# Patient Record
Sex: Female | Born: 2000 | Race: Black or African American | Hispanic: No | Marital: Single | Smoking: Never smoker
Health system: Southern US, Community
[De-identification: ages and names within clinical notes are randomized; demographics above are authoritative.]

## PROBLEM LIST (undated history)

## (undated) DIAGNOSIS — J45909 Unspecified asthma, uncomplicated: Secondary | ICD-10-CM

## (undated) DIAGNOSIS — F4010 Social phobia, unspecified: Secondary | ICD-10-CM

## (undated) HISTORY — PX: ADENOIDECTOMY: SUR15

## (undated) HISTORY — PX: TONSILLECTOMY: SUR1361

---
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
## 2020-03-25 ENCOUNTER — Other Ambulatory Visit: Payer: Medicaid Other

## 2020-03-29 ENCOUNTER — Emergency Department
Admission: EM | Admit: 2020-03-29 | Discharge: 2020-03-29 | Disposition: A | Discharge status: Home / Self Care | Payer: Medicaid Other | Attending: Emergency Medicine | Admitting: Emergency Medicine

## 2020-03-29 ENCOUNTER — Encounter: Payer: Self-pay | Admitting: Emergency Medicine

## 2020-03-29 ENCOUNTER — Other Ambulatory Visit: Payer: Self-pay

## 2020-03-29 ENCOUNTER — Emergency Department: Payer: Medicaid Other

## 2020-03-29 DIAGNOSIS — R05 Cough: Secondary | ICD-10-CM | POA: Insufficient documentation

## 2020-03-29 DIAGNOSIS — R059 Cough, unspecified: Secondary | ICD-10-CM

## 2020-03-29 DIAGNOSIS — Z8616 Personal history of COVID-19: Secondary | ICD-10-CM | POA: Insufficient documentation

## 2020-03-29 DIAGNOSIS — R509 Fever, unspecified: Secondary | ICD-10-CM | POA: Diagnosis not present

## 2020-03-29 MED ORDER — AZITHROMYCIN 250 MG PO TABS: ORAL_TABLET | ORAL | 0 refills | Status: AC

## 2020-03-29 MED ORDER — PREDNISONE 50 MG PO TABS: ORAL_TABLET | ORAL | 0 refills | Status: DC

## 2020-03-29 NOTE — ED Triage Notes (Signed)
Pt reports cough for the past 2 weeks with congestion sore throat and fever, reports temp of 101.0 at home without medication, pt states that her work place wants her to be covid tested

## 2020-03-29 NOTE — ED Notes (Addendum)
Tested pos on 02/08/2021See triage note  Presents with a 2 week hx of cough and sore throat  States she did have temp PTA of 101  Low grade noted on arrival  States her job wants her to have another COVID test  States she has had COVID in past   Tested pos.on02/06/2020

## 2020-03-29 NOTE — Discharge Instructions (Addendum)
Take azithromycin as directed. Take prednisone once daily for the next 5 days 

## 2020-03-29 NOTE — ED Provider Notes (Signed)
Emergency Department Provider Note  ____________________________________________  Time seen: Approximately 7:20 PM  I have reviewed the triage vital signs and the nursing notes.   HISTORY  Chief Complaint Cough   Historian Patient     HPI Katherine Wong is a 19 y.o. female presents to the emergency department with 2 weeks of productive cough and fever that started today.  Patient denies associated rhinorrhea or nasal congestion.  She states that she had COVID-19 in February of this year.  No chest pain, chest tightness or abdominal pain.  She has had headache and diarrhea.  No sick contacts in the home.  No other alleviating measures have been attempted.   Past Medical History:  Diagnosis Date  . Asthma    as a child  . Social anxiety disorder 02/23/2016     Immunizations up to date:  Yes.     Past Medical History:  Diagnosis Date  . Asthma    as a child  . Social anxiety disorder 02/23/2016    Patient Active Problem List   Diagnosis Date Noted  . Social anxiety disorder 02/23/2016  . MDD (major depressive disorder), recurrent episode, severe ([place]) 02/22/2016  . Right ovarian cyst 10/04/2015    Past Surgical History:  Procedure Laterality Date  . ADENOIDECTOMY    . LAPAROSCOPY N/A 10/04/2015   Procedure: LAPAROSCOPY DIAGNOSTIC;  Surgeon: Aletha Halim, MD;  Location: [facility] ORS;  Service: Gynecology;  Laterality: N/A;  . OVARIAN CYST REMOVAL Right 10/04/2015   Procedure: OVARIAN CYSTECTOMY;  Surgeon: Aletha Halim, MD;  Location: [facility] ORS;  Service: Gynecology;  Laterality: Right;  . TONSILLECTOMY      Prior to Admission medications   Medication Sig Start Date End Date Taking? Authorizing Provider  azithromycin (ZITHROMAX Z-PAK) 250 MG tablet Take 2 tablets (500 mg) on  Day 1,  followed by 1 tablet (250 mg) once daily on Days 2 through 5. 03/29/20 04/03/20  Lannie Fields, PA-C  predniSONE (DELTASONE) 50 MG tablet Take one tablet once daily for the next  five days. 03/29/20   Lannie Fields, PA-C    Allergies Vancomycin and Influenza vaccines  No family history on file.  Social History Social History   Tobacco Use  . Smoking status: Never Smoker  . Smokeless tobacco: Never Used  Substance Use Topics  . Alcohol use: No  . Drug use: No     Review of Systems  Constitutional: Patient has fever.  Eyes:  No discharge ENT: No upper respiratory complaints. Respiratory: Patient has cough.  Gastrointestinal:   No nausea, no vomiting.  No diarrhea.  No constipation. Musculoskeletal: Negative for musculoskeletal pain. Skin: Negative for rash, abrasions, lacerations, ecchymosis.    ____________________________________________   PHYSICAL EXAM:  VITAL SIGNS: ED Triage Vitals  Enc Vitals Group     BP 03/29/20 1816 122/80     Pulse Rate 03/29/20 1816 99     Resp 03/29/20 1816 16     Temp 03/29/20 1816 99.8 F (37.7 C)     Temp Source 03/29/20 1816 Oral     SpO2 03/29/20 1816 98 %     Weight 03/29/20 1815 247 lb (112 kg)     Height 03/29/20 1815 5\' 3"  (1.6 m)     Head Circumference --      Peak Flow --      Pain Score 03/29/20 1825 5     Pain Loc --      Pain Edu? --      Excl.  in GC? --      Constitutional: Alert and oriented. Well appearing and in no acute distress. Eyes: Conjunctivae are normal. PERRL. EOMI. Head: Atraumatic. ENT:      Nose: No congestion/rhinnorhea.      Mouth/Throat: Mucous membranes are moist.  Neck: No stridor.  No cervical spine tenderness to palpation. Cardiovascular: Normal rate, regular rhythm. Normal S1 and S2.  Good peripheral circulation. Respiratory: Normal respiratory effort without tachypnea or retractions. Lungs CTAB. Good air entry to the bases with no decreased or absent breath sounds Gastrointestinal: Bowel sounds x 4 quadrants. Soft and nontender to palpation. No guarding or rigidity. No distention. Musculoskeletal: Full range of motion to all extremities. No obvious deformities  noted Neurologic:  Normal for age. No gross focal neurologic deficits are appreciated.  Skin:  Skin is warm, dry and intact. No rash noted. Psychiatric: Mood and affect are normal for age. Speech and behavior are normal.   ____________________________________________   LABS (all labs ordered are listed, but only abnormal results are displayed)  Labs Reviewed  SARS CORONAVIRUS 2 (TAT 6-24 HRS)   ____________________________________________  EKG   ____________________________________________  RADIOLOGY Geraldo Pitter, personally viewed and evaluated these images (plain radiographs) as part of my medical decision making, as well as reviewing the written report by the radiologist.  DG Chest 1 View  Result Date: 03/29/2020 CLINICAL DATA:  Cough EXAM: CHEST  1 VIEW COMPARISON:  03/31/2017 FINDINGS: The heart size and mediastinal contours are within normal limits. Both lungs are clear. The visualized skeletal structures are unremarkable. IMPRESSION: No active disease. Electronically Signed   By: Charlett Nose M.D.   On: 03/29/2020 18:53    ____________________________________________    PROCEDURES  Procedure(s) performed:     Procedures     Medications - No data to display   ____________________________________________   INITIAL IMPRESSION / ASSESSMENT AND PLAN / ED COURSE  Pertinent labs & imaging results that were available during my care of the patient were reviewed by me and considered in my medical decision making (see chart for details).      Assessment and plan Bronchitis 19 year old female presents to the emergency department with productive cough with purulent sputum production for the past 2 weeks.  Patient was hypertensive at triage but vital signs were otherwise reassuring.  She had no adventitious lung sounds auscultated.  Patient underwent send off COVID-19 testing.  I treated patient with azithromycin and prednisone.  Rest and hydration were  encouraged at home.  Return precautions were given to return with new or worsening symptoms.   ____________________________________________  FINAL CLINICAL IMPRESSION(S) / ED DIAGNOSES  Final diagnoses:  Cough      NEW MEDICATIONS STARTED DURING THIS VISIT:  ED Discharge Orders         Ordered    azithromycin (ZITHROMAX Z-PAK) 250 MG tablet     03/29/20 1957    predniSONE (DELTASONE) 50 MG tablet     03/29/20 1957              This chart was dictated using voice recognition software/Dragon. Despite best efforts to proofread, errors can occur which can change the meaning. Any change was purely unintentional.     Orvil Feil, PA-C 03/29/20 2048    Phineas Semen, MD 03/29/20 [phone]

## 2020-06-16 ENCOUNTER — Telehealth: Payer: Self-pay | Admitting: Family Medicine

## 2020-06-16 NOTE — Telephone Encounter (Signed)
Pt needs an excuse note for work from 05/26/20 visit and the morning of 7/29.

## 2020-06-16 NOTE — Telephone Encounter (Signed)
Call to client to ascertain need. Per client, felt like had food poisoning and missed work yesterday and this am. States called to make appt to be seen today, but told could not be seen. States if was able to be seen, hoped to get note for work. Client states ended up going to Urgent Care this am. Client counseled that our agency does not offer a "sick clinic" like an Urgent Care. Jossie Ng, RN

## 2021-11-16 ENCOUNTER — Other Ambulatory Visit: Payer: Self-pay

## 2021-11-16 ENCOUNTER — Emergency Department: Payer: Medicaid Other

## 2021-11-16 ENCOUNTER — Emergency Department
Admission: EM | Admit: 2021-11-16 | Discharge: 2021-11-16 | Disposition: A | Discharge status: Home / Self Care | Payer: Medicaid Other | Attending: Emergency Medicine | Admitting: Emergency Medicine

## 2021-11-16 DIAGNOSIS — J45909 Unspecified asthma, uncomplicated: Secondary | ICD-10-CM | POA: Insufficient documentation

## 2021-11-16 DIAGNOSIS — J111 Influenza due to unidentified influenza virus with other respiratory manifestations: Secondary | ICD-10-CM | POA: Diagnosis present

## 2021-11-16 MED ORDER — ALBUTEROL SULFATE HFA 108 (90 BASE) MCG/ACT IN AERS: 2.0000 | INHALATION_SPRAY | Freq: Four times a day (QID) | RESPIRATORY_TRACT | 2 refills | Status: AC | PRN

## 2021-11-16 NOTE — ED Provider Notes (Signed)
[facility] Emergency Department Provider Note    ____________________________________________   Event Date/Time   First MD Initiated Contact with Patient 11/16/21 1620     (approximate)  I have reviewed the triage vital signs and the nursing notes.   HISTORY  Chief Complaint Influenza   HPI Katherine Wong is a 20 y.o. female, history of asthma and social anxiety, presents the emergency department for evaluation of influenza.  Patient was diagnosed with flu 2 days ago, despite negative flu/COVID testing at urgent care.  She was prescribed Tamiflu and has been taking Tylenol.  She states that despite these treatments, she says she feels worse today.  Reports chest tightness and body aches.  Denies fever/chills, abdominal pain, urinary symptoms, headaches, back pain, or flank pain.  History limited by: No limitations.  Past Medical History:  Diagnosis Date   Asthma    as a child   Social anxiety disorder 02/23/2016    Patient Active Problem List   Diagnosis Date Noted   Social anxiety disorder 02/23/2016   MDD (major depressive disorder), recurrent episode, severe ([place]) 02/22/2016   Right ovarian cyst 10/04/2015    Past Surgical History:  Procedure Laterality Date   ADENOIDECTOMY     LAPAROSCOPY N/A 10/04/2015   Procedure: LAPAROSCOPY DIAGNOSTIC;  Surgeon: Aletha Halim, MD;  Location: [facility] ORS;  Service: Gynecology;  Laterality: N/A;   OVARIAN CYST REMOVAL Right 10/04/2015   Procedure: OVARIAN CYSTECTOMY;  Surgeon: Aletha Halim, MD;  Location: [facility] OR[facility];  Service: Gynecology;  Laterality: Right;   TONSILLECTOMY      Prior to Admission medications   Medication Sig Start Date End Date Taking? Authorizing Provider  albuterol (VENTOLIN HFA) 108 (90 Base) MCG/ACT inhaler Inhale 2 puffs into the lungs every 6 (six) hours as needed for wheezing or shortness of breath. 11/16/21  Yes Teodoro Spray, PA  predniSONE (DELTASONE) 50 MG tablet Take  one tablet once daily for the next five days. 03/29/20   Lannie Fields, PA-C    Allergies Vancomycin and Influenza vaccines  No family history on file.  Social History Social History   Tobacco Use   Smoking status: Never   Smokeless tobacco: Never  Substance Use Topics   Alcohol use: No   Drug use: No    Review of Systems Review of Systems  Constitutional:  Negative for chills and fever.  HENT:  Negative for ear pain and sore throat.   Eyes:  Negative for blurred vision.  Respiratory:  Positive for cough. Negative for sputum production and shortness of breath.   Cardiovascular:  Positive for chest pain. Negative for leg swelling.  Gastrointestinal:  Negative for abdominal pain and vomiting.  Genitourinary:  Negative for dysuria, flank pain and hematuria.  Musculoskeletal:  Positive for myalgias.  Skin:  Negative for rash.  Neurological:  Negative for headaches.    10-point ROS otherwise negative. ____________________________________________   PHYSICAL EXAM:  VITAL SIGNS: ED Triage Vitals  Enc Vitals Group     BP 11/16/21 1526 124/78     Pulse Rate 11/16/21 1526 95     Resp 11/16/21 1526 20     Temp 11/16/21 1526 98.7 F (37.1 C)     Temp Source 11/16/21 1526 Oral     SpO2 11/16/21 1526 97 %     Weight 11/16/21 1524 201 lb (91.2 kg)     Height 11/16/21 1524 5\' 3"  (1.6 m)     Head Circumference --  Peak Flow --      Pain Score 11/16/21 1524 9     Pain Loc --      Pain Edu? --      Excl. in GC? --     Physical Exam Constitutional:      General: She is not in acute distress.    Appearance: Normal appearance. She is not ill-appearing.  HENT:     Head: Normocephalic.     Nose: Nose normal.     Mouth/Throat:     Mouth: Mucous membranes are moist.     Pharynx: Oropharynx is clear. No oropharyngeal exudate or posterior oropharyngeal erythema.  Eyes:     Conjunctiva/sclera: Conjunctivae normal.     Pupils: Pupils are equal, round, and reactive to  light.  Cardiovascular:     Rate and Rhythm: Normal rate and regular rhythm.  Pulmonary:     Effort: Pulmonary effort is normal. No respiratory distress.     Breath sounds: Normal breath sounds. No wheezing, rhonchi or rales.  Abdominal:     General: Abdomen is flat. There is no distension.     Palpations: Abdomen is soft.  Musculoskeletal:        General: Normal range of motion.     Cervical back: Normal range of motion and neck supple.  Skin:    General: Skin is warm and dry.  Neurological:     General: No focal deficit present.     Mental Status: She is alert. Mental status is at baseline.  Psychiatric:        Mood and Affect: Mood normal.        Behavior: Behavior normal.        Thought Content: Thought content normal.        Judgment: Judgment normal.     ____________________________________________    LABS  (all labs ordered are listed, but only abnormal results are displayed)  Labs Reviewed - No data to display   ____________________________________________   EKG Not applicable.   ____________________________________________    RADIOLOGY I personally viewed and evaluated these images as part of my medical decision making, as well as reviewing the written report by the radiologist.  ED Provider Interpretation: I agree with the interpretation of the radiologist.  No active cardiopulmonary disease.  DG Chest 2 View  Result Date: 11/16/2021 CLINICAL DATA:  Shortness of breath EXAM: CHEST - 2 VIEW COMPARISON:  03/29/2020 FINDINGS: The heart size and mediastinal contours are within normal limits. Both lungs are clear. The visualized skeletal structures are unremarkable. IMPRESSION: No active cardiopulmonary disease. Electronically Signed   By: Jasmine Pang M.D.   On: 11/16/2021 17:52    ____________________________________________   PROCEDURES  Procedures   Medications - No data to display  Critical Care performed: No   ____________________________________________   INITIAL IMPRESSION / ASSESSMENT AND PLAN / ED COURSE  Pertinent labs & imaging results that were available during my care of the patient were reviewed by me and considered in my medical decision making (see chart for details).      Katherine Wong is a 20 y.o. female, history of asthma and social anxiety, presents the emergency department for evaluation of influenza.  Patient was diagnosed with flu 2 days ago, despite negative flu/COVID testing at urgent care.  She was prescribed Tamiflu and has been taking Tylenol.  She states that despite these treatments, she says she feels worse today.  Reports chest tightness and body aches.  Denies fever/chills, abdominal pain, urinary symptoms,  headaches, back pain, or flank pain.  Upon entering the room, patient appears well.  NAD.  Physical exam overall unremarkable.  Lung sounds are clear bilaterally.  Throat is not erythematous.  No exudates.  Uvula midline.  Abdomen soft and nondistended.  Vital signs are within normal limits.  Chest x-ray shows no focal consolidations.  No active cardiopulmonary disease.  Given the patient's history, physical exam, and work-up, I suspect that the patient is experiencing an influenza-like illness.  I do not suspect any life-threatening pathology.  Given that she is only 2 days into her symptoms, it is understandable that she may feel worse today than she did yesterday.  We will discharge this patient with a prescription for albuterol as well as educational material, anticipatory guidance, and return precautions.  Encouraged the patient to return to the emergency department at any time if she begins to experience any new or worsening symptoms.           ____________________________________________   FINAL CLINICAL IMPRESSION(S) / ED DIAGNOSES  Final diagnoses:  Influenza     NEW MEDICATIONS STARTED DURING THIS VISIT:  ED Discharge Orders           Ordered    albuterol (VENTOLIN HFA) 108 (90 Base) MCG/ACT inhaler  Every 6 hours PRN        11/16/21 1906             Note:  This document was prepared using Dragon voice recognition software and may include unintentional dictation errors.    Teodoro Spray, [place] 11/17/21 [id number]    Vladimir Crofts, MD 11/17/21 [phone]

## 2021-11-16 NOTE — Discharge Instructions (Addendum)
-  Please return to the emergency department if you begin to experience any new or worsening symptoms. °

## 2021-11-16 NOTE — ED Triage Notes (Signed)
Pt states she was diagnosed with the flu 2 days ago despite testing negative for flu and covid on [facility] rapid test- pt states she feels worse today despite taking the tamiflu and taking tylenol

## 2022-03-11 IMAGING — CR DG CHEST 2V
2 series · 2 of 2 positions shown · non-contrast
Comparison: 03/29/2020

CLINICAL DATA: Shortness of breath

EXAM:
CHEST - 2 VIEW

[chest pa]
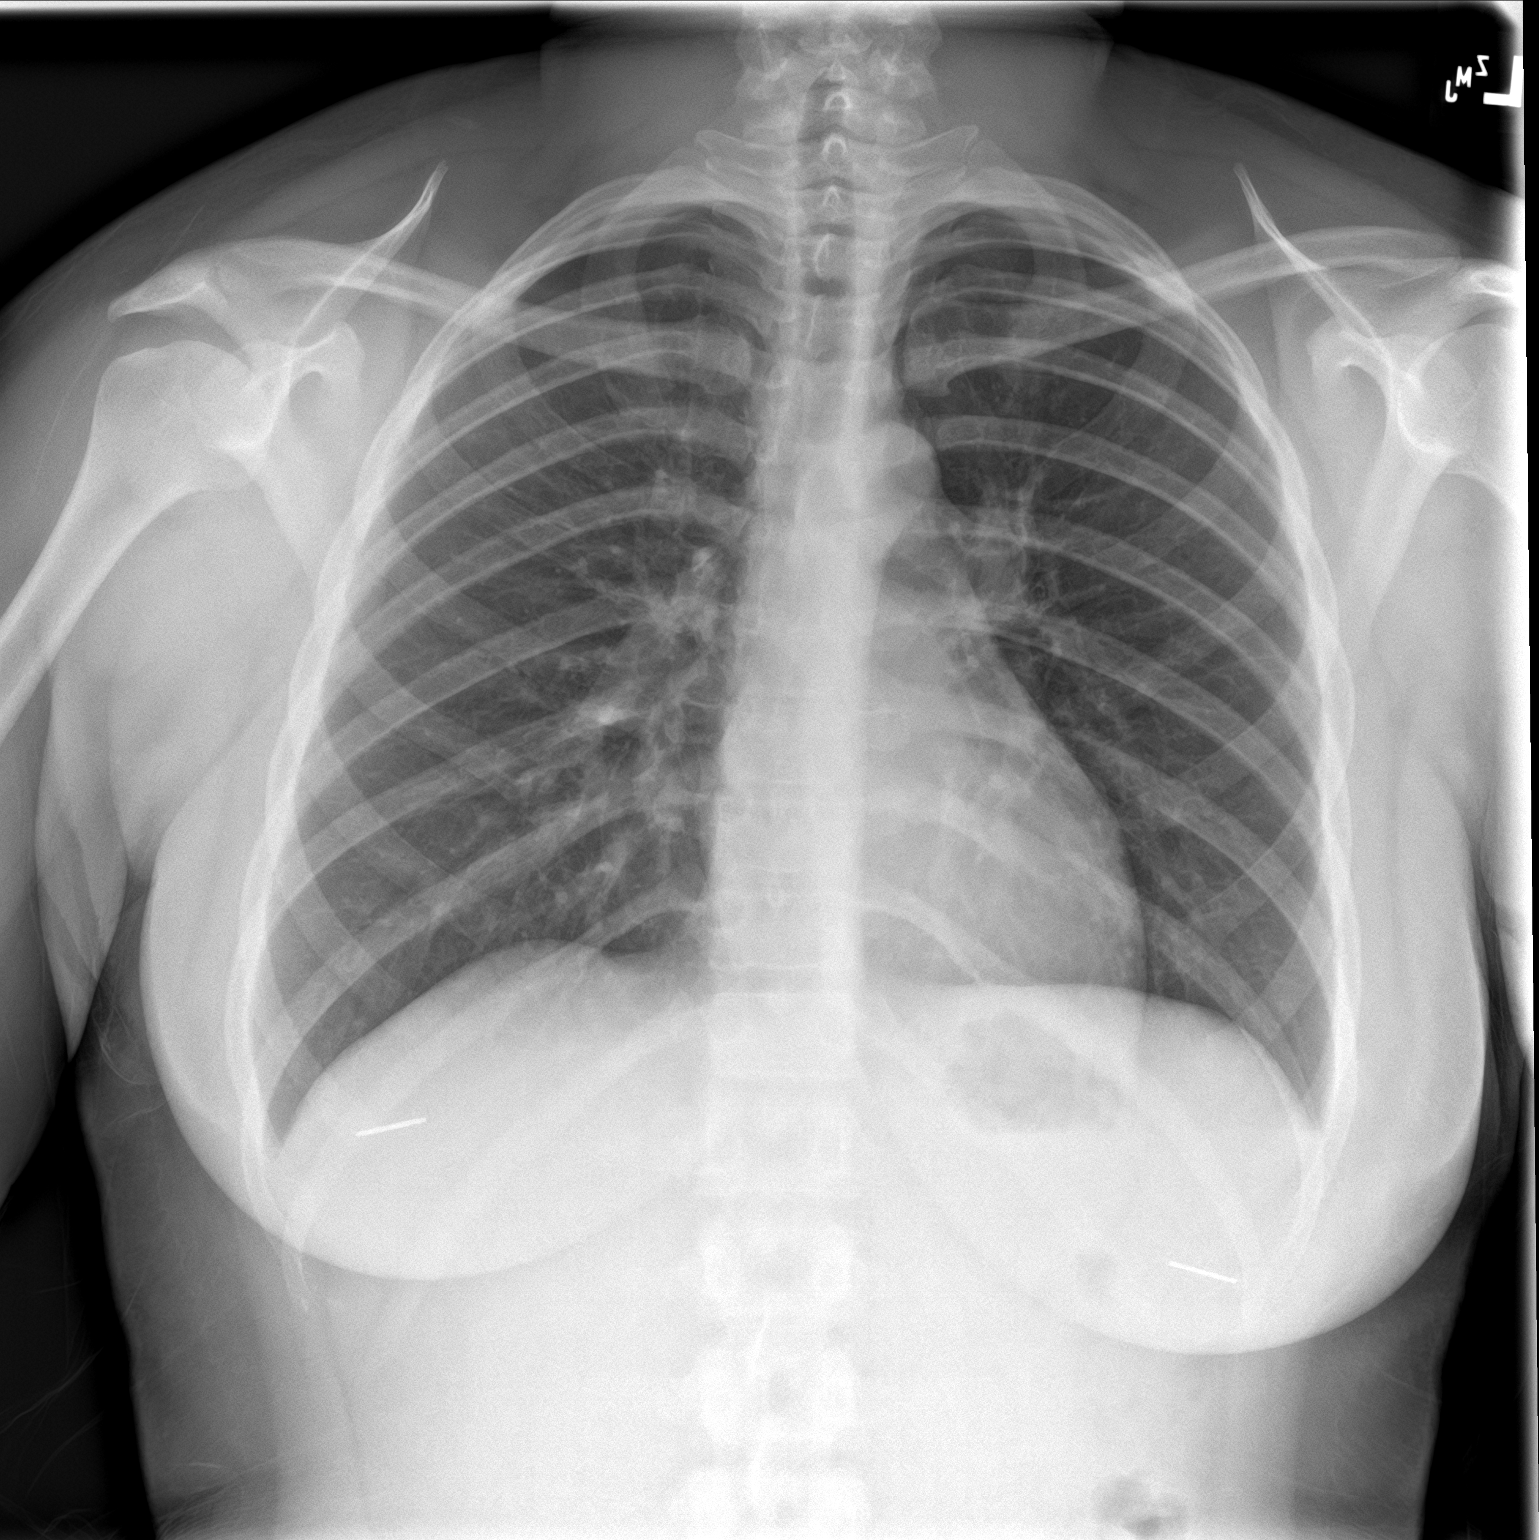

[chest lat]
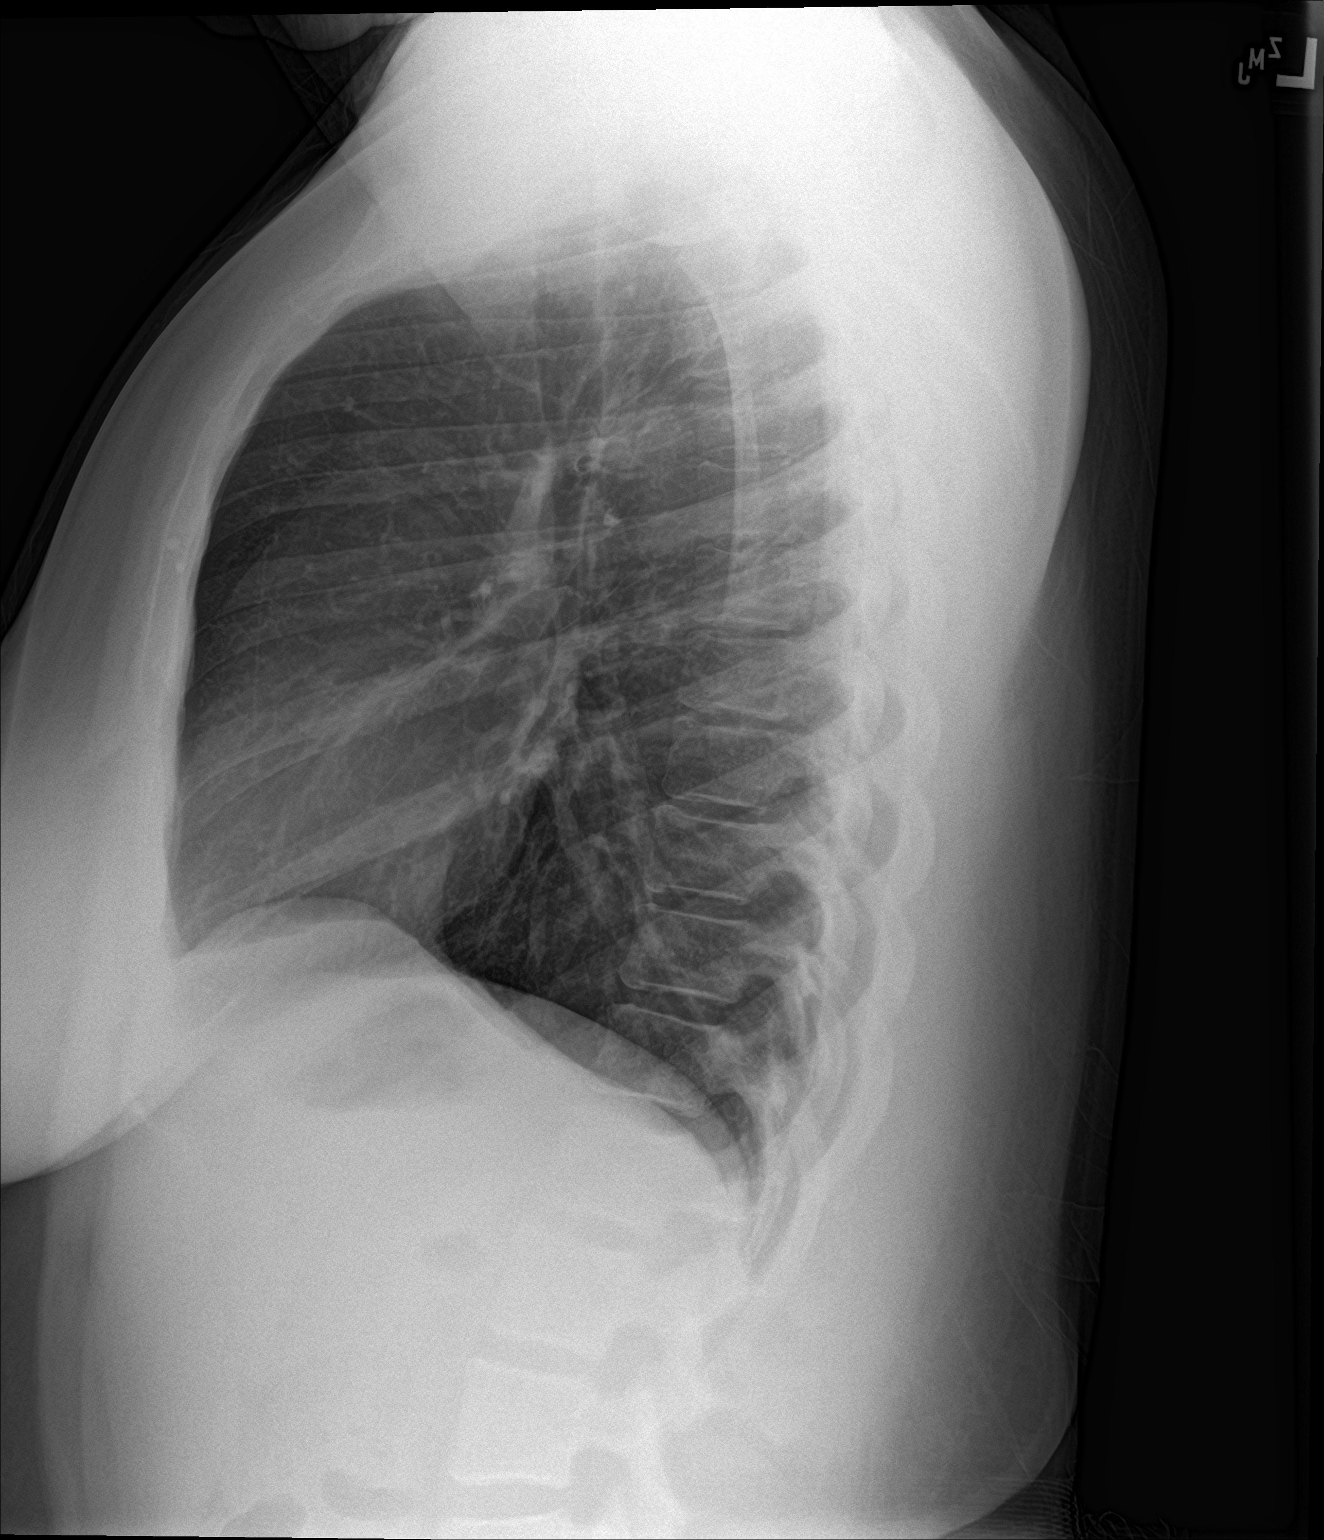

[2 of 2 positions shown; findings below may reference images not displayed]

FINDINGS: The heart size and mediastinal contours are within normal limits.
Both lungs are clear. The visualized skeletal structures are
unremarkable.
IMPRESSION: No active cardiopulmonary disease.

## 2022-06-15 ENCOUNTER — Emergency Department
Admission: EM | Admit: 2022-06-15 | Discharge: 2022-06-15 | Disposition: A | Discharge status: Home / Self Care | Payer: Medicaid Other | Attending: Emergency Medicine | Admitting: Emergency Medicine

## 2022-06-15 ENCOUNTER — Emergency Department: Payer: Medicaid Other

## 2022-06-15 ENCOUNTER — Other Ambulatory Visit: Payer: Self-pay

## 2022-06-15 DIAGNOSIS — R7989 Other specified abnormal findings of blood chemistry: Secondary | ICD-10-CM | POA: Diagnosis not present

## 2022-06-15 DIAGNOSIS — J45909 Unspecified asthma, uncomplicated: Secondary | ICD-10-CM | POA: Insufficient documentation

## 2022-06-15 DIAGNOSIS — E039 Hypothyroidism, unspecified: Secondary | ICD-10-CM | POA: Insufficient documentation

## 2022-06-15 DIAGNOSIS — R1030 Lower abdominal pain, unspecified: Secondary | ICD-10-CM | POA: Diagnosis present

## 2022-06-15 DIAGNOSIS — R112 Nausea with vomiting, unspecified: Secondary | ICD-10-CM | POA: Insufficient documentation

## 2022-06-15 LAB — COMPREHENSIVE METABOLIC PANEL
ALT: 19 U/L (ref 0–44)
AST: 19 U/L (ref 15–41)
Albumin: 4.6 g/dL (ref 3.5–5.0)
Alkaline Phosphatase: 52 U/L (ref 38–126)
Anion gap: 7 (ref 5–15)
BUN: 12 mg/dL (ref 6–20)
CO2: 24 mmol/L (ref 22–32)
Calcium: 9.2 mg/dL (ref 8.9–10.3)
Chloride: 109 mmol/L (ref 98–111)
Creatinine, Ser: 1.08 mg/dL — ABNORMAL HIGH (ref 0.44–1.00)
GFR, Estimated: >60 mL/min (ref >60)
Glucose, Bld: 93 mg/dL (ref 70–99)
Potassium: 4 mmol/L (ref 3.5–5.1)
Sodium: 140 mmol/L (ref 135–145)
Total Bilirubin: 0.3 mg/dL (ref 0.3–1.2)
Total Protein: 8 g/dL (ref 6.5–8.1)

## 2022-06-15 LAB — URINALYSIS, ROUTINE W REFLEX MICROSCOPIC
Bilirubin Urine: NEGATIVE
Glucose, UA: NEGATIVE mg/dL
Hgb urine dipstick: NEGATIVE
Ketones, ur: 5 mg/dL — AB
Nitrite: NEGATIVE
Protein, ur: NEGATIVE mg/dL
Specific Gravity, Urine: 1.029 (ref 1.005–1.030)
pH: 5 (ref 5.0–8.0)

## 2022-06-15 LAB — CBC
HCT: 42.6 % (ref 36.0–46.0)
Hemoglobin: 13.7 g/dL (ref 12.0–15.0)
MCH: 29.7 pg (ref 26.0–34.0)
MCHC: 32.2 g/dL (ref 30.0–36.0)
MCV: 92.4 fL (ref 80.0–100.0)
Platelets: 241 10*3/uL (ref 150–400)
RBC: 4.61 MIL/uL (ref 3.87–5.11)
RDW: 13.6 % (ref 11.5–15.5)
WBC: 7.2 10*3/uL (ref 4.0–10.5)
nRBC: 0 % (ref 0.0–0.2)

## 2022-06-15 LAB — POC URINE PREG, ED: Preg Test, Ur: NEGATIVE

## 2022-06-15 LAB — LIPASE, BLOOD: Lipase: 29 U/L (ref 11–51)

## 2022-06-15 MED ORDER — DICYCLOMINE HCL 10 MG PO CAPS
20.0000 mg | ORAL_CAPSULE | Freq: Once | ORAL | Status: AC
  Administered 2022-06-15: 20 mg via ORAL
  Filled 2022-06-15: qty 2

## 2022-06-15 MED ORDER — KETOROLAC TROMETHAMINE 30 MG/ML IJ SOLN
30.0000 mg | Freq: Once | INTRAMUSCULAR | Status: AC
Start: 2022-06-15 — End: 2022-06-15
  Administered 2022-06-15: 30 mg via INTRAVENOUS
  Filled 2022-06-15: qty 1

## 2022-06-15 MED ORDER — ONDANSETRON 4 MG PO TBDP: 4.0000 mg | ORAL_TABLET | Freq: Four times a day (QID) | ORAL | 0 refills | Status: DC | PRN

## 2022-06-15 MED ORDER — IBUPROFEN 800 MG PO TABS: 800.0000 mg | ORAL_TABLET | Freq: Three times a day (TID) | ORAL | 0 refills | Status: AC | PRN

## 2022-06-15 MED ORDER — MORPHINE SULFATE (PF) 4 MG/ML IV SOLN
4.0000 mg | Freq: Once | INTRAVENOUS | Status: AC
  Administered 2022-06-15: 4 mg via INTRAVENOUS
  Filled 2022-06-15: qty 1

## 2022-06-15 MED ORDER — ACETAMINOPHEN 500 MG PO TABS
1000.0000 mg | ORAL_TABLET | Freq: Once | ORAL | Status: AC
  Administered 2022-06-15: 1000 mg via ORAL
  Filled 2022-06-15: qty 2

## 2022-06-15 MED ORDER — ONDANSETRON HCL 4 MG/2ML IJ SOLN
4.0000 mg | Freq: Once | INTRAMUSCULAR | Status: AC
  Administered 2022-06-15: 4 mg via INTRAVENOUS
  Filled 2022-06-15: qty 2

## 2022-06-15 MED ORDER — DICYCLOMINE HCL 20 MG PO TABS: 20.0000 mg | ORAL_TABLET | Freq: Three times a day (TID) | ORAL | 0 refills | Status: DC | PRN

## 2022-06-15 MED ORDER — SODIUM CHLORIDE 0.9 % IV BOLUS (SEPSIS)
1000.0000 mL | Freq: Once | INTRAVENOUS | Status: AC
  Administered 2022-06-15: 1000 mL via INTRAVENOUS

## 2022-06-15 MED ORDER — IOHEXOL 300 MG/ML  SOLN
100.0000 mL | Freq: Once | INTRAMUSCULAR | Status: AC | PRN
  Administered 2022-06-15: 100 mL via INTRAVENOUS

## 2022-06-15 NOTE — ED Triage Notes (Signed)
Pt presents to ER c/o generalized abd pain that started tonight around midnight.  Pt endorses n/v/d with this pain.  Pt states she has been throwing up since 7/23.  Pt seen at [facility] and given rx for zofran and discharged.  Pt states she has had ruptured ovarian cysts in past and states this pain feels similar.  Pt is A&O x4 at this time.  Pt appears uncomfortable in triage.

## 2022-06-15 NOTE — ED Provider Notes (Addendum)
[facility] Provider Note    Event Date/Time   First MD Initiated Contact with Patient 06/15/22 [phone]     (approximate)   History   Abdominal Pain   HPI  Katherine Wong is a 21 y.o. female with history of bipolar disorder, hypothyroidism, ovarian cyst who presents to the emergency department complaints of abdominal pain, nausea and vomiting for the past week.  States pain feels similar to her previous ovarian cyst.  No fevers, diarrhea, dysuria, hematuria, vaginal bleeding or discharge.  Has had previous ovarian cystectomy.   History provided by patient and mother.    Past Medical History:  Diagnosis Date   Asthma    as a child   Social anxiety disorder 02/23/2016    Past Surgical History:  Procedure Laterality Date   ADENOIDECTOMY     LAPAROSCOPY N/A 10/04/2015   Procedure: LAPAROSCOPY DIAGNOSTIC;  Surgeon: Orleans Bing, MD;  Location: [facility] ORS;  Service: Gynecology;  Laterality: N/A;   OVARIAN CYST REMOVAL Right 10/04/2015   Procedure: OVARIAN CYSTECTOMY;  Surgeon:  Bing, MD;  Location: [facility] ORS;  Service: Gynecology;  Laterality: Right;   TONSILLECTOMY      MEDICATIONS:  Prior to Admission medications   Medication Sig Start Date End Date Taking? Authorizing Provider  albuterol (VENTOLIN HFA) 108 (90 Base) MCG/ACT inhaler Inhale 2 puffs into the lungs every 6 (six) hours as needed for wheezing or shortness of breath. 11/16/21   Varney Daily, PA  predniSONE (DELTASONE) 50 MG tablet Take one tablet once daily for the next five days. 03/29/20   Orvil Feil, PA-C    Physical Exam   Triage Vital Signs: ED Triage Vitals  Enc Vitals Group     BP 06/15/22 0221 118/79     Pulse Rate 06/15/22 0221 74     Resp 06/15/22 0221 16     Temp 06/15/22 0221 98.7 F (37.1 C)     Temp Source 06/15/22 0221 Oral     SpO2 06/15/22 0221 97 %     Weight 06/15/22 0222 205 lb (93 kg)     Height 06/15/22 0222 5\' 4"  (1.626 m)     Head  Circumference --      Peak Flow --      Pain Score 06/15/22 0225 9     Pain Loc --      Pain Edu? --      Excl. in [place]? --     Most recent vital signs: Vitals:   06/15/22 0615 06/15/22 0741  BP: 121/74   Pulse: (!) 52   Resp: 18   Temp:  97.9 F (36.6 C)  SpO2: 100%     CONSTITUTIONAL: Alert and oriented and responds appropriately to questions.  Peers uncomfortable, tearful HEAD: Normocephalic, atraumatic EYES: Conjunctivae clear, pupils appear equal, sclera nonicteric ENT: normal nose; moist mucous membranes NECK: Supple, normal ROM CARD: RRR; S1 and S2 appreciated; no murmurs, no clicks, no rubs, no gallops RESP: Normal chest excursion without splinting or tachypnea; breath sounds clear and equal bilaterally; no wheezes, no rhonchi, no rales, no hypoxia or respiratory distress, speaking full sentences ABD/GI: Normal bowel sounds; non-distended; soft, diffusely tender throughout the pelvic region without guarding or rebound BACK: The back appears normal EXT: Normal ROM in all joints; no deformity noted, no edema; no cyanosis SKIN: Normal color for age and race; warm; no rash on exposed skin NEURO: Moves all extremities equally, normal speech PSYCH: The patient's mood and manner are  appropriate.   ED Results / Procedures / Treatments   LABS: (all labs ordered are listed, but only abnormal results are displayed) Labs Reviewed  COMPREHENSIVE METABOLIC PANEL - Abnormal; Notable for the following components:      Result Value   Creatinine, Ser 1.08 (*)    All other components within normal limits  URINALYSIS, ROUTINE W REFLEX MICROSCOPIC - Abnormal; Notable for the following components:   Color, Urine YELLOW (*)    APPearance HAZY (*)    Ketones, ur 5 (*)    Leukocytes,Ua TRACE (*)    Bacteria, UA RARE (*)    All other components within normal limits  LIPASE, BLOOD  CBC  POC URINE PREG, ED     EKG:   RADIOLOGY: My personal review and interpretation of imaging:  Ultrasound shows no acute abnormality.  Normal blood flow to both ovaries.  I have personally reviewed all radiology reports.   CT ABDOMEN PELVIS W CONTRAST  Result Date: 06/15/2022 CLINICAL DATA:  21 year old female with history of right lower quadrant abdominal pain. Nausea and vomiting. EXAM: CT ABDOMEN AND PELVIS WITH CONTRAST TECHNIQUE: Multidetector CT imaging of the abdomen and pelvis was performed using the standard protocol following bolus administration of intravenous contrast. RADIATION DOSE REDUCTION: This exam was performed according to the departmental dose-optimization program which includes automated exposure control, adjustment of the mA and/or kV according to patient size and/or use of iterative reconstruction technique. CONTRAST:  OMNIPAQUE IOHEXOL 300 MG/ML  SOLN COMPARISON:  CT of the abdomen and pelvis 08/18/2019. FINDINGS: Lower chest: Unremarkable. Hepatobiliary: No suspicious cystic or solid hepatic lesions. No intra or extrahepatic biliary ductal dilatation. Gallbladder is normal in appearance. Pancreas: No pancreatic mass. No pancreatic ductal dilatation. No pancreatic or peripancreatic fluid collections or inflammatory changes. Spleen: Unremarkable. Adrenals/Urinary Tract: Bilateral kidneys and adrenal glands are normal in appearance. No hydroureteronephrosis. Urinary bladder is nearly decompressed, but otherwise unremarkable in appearance. Stomach/Bowel: The appearance of the stomach is normal. No pathologic dilatation of small bowel or colon. Normal appendix. Vascular/Lymphatic: No significant atherosclerotic disease, aneurysm or dissection noted in the abdominal or pelvic vasculature. No lymphadenopathy noted in the abdomen or pelvis. Reproductive: Uterus and ovaries are unremarkable in appearance. Other: Trace volume of free fluid in the cul-de-sac, presumably physiologic in this young female patient. No larger volume of ascites. No pneumoperitoneum. Musculoskeletal: There  are no aggressive appearing lytic or blastic lesions noted in the visualized portions of the skeleton. IMPRESSION: 1. No acute findings are noted in the abdomen or pelvis to account for the patient's symptoms. Specifically, the appendix is normal. Electronically Signed   By: Trudie Reed M.D.   On: 06/15/2022 07:39   US PELVIC COMPLETE W TRANSVAGINAL AND TORSION R/O  Result Date: 06/15/2022 CLINICAL DATA:  Pelvic pain since midnight. EXAM: TRANSABDOMINAL AND TRANSVAGINAL ULTRASOUND OF PELVIS DOPPLER ULTRASOUND OF OVARIES TECHNIQUE: Both transabdominal and transvaginal ultrasound examinations of the pelvis were performed. Transabdominal technique was performed for global imaging of the pelvis including uterus, ovaries, adnexal regions, and pelvic cul-de-sac. It was necessary to proceed with endovaginal exam following the transabdominal exam to visualize the ovaries and endometrium better. Color and duplex Doppler ultrasound was utilized to evaluate blood flow to the ovaries. COMPARISON:  Pelvic ultrasound 08/18/2019, CT with IV contrast 08/18/2019. FINDINGS: Uterus Measurements: 5.9 x 3.3 x 4.1 cm = volume: 40.2 mL. No fibroids or other mass visualized. Unremarkable cervix. Endometrium Thickness: 8.4 mm.  No focal abnormality visualized. Right ovary Measurements: 3.3 x 1.7  x 1.6 cm = volume: 4.5 mL. Normal appearance/no adnexal mass. A small calcification is again noted within this ovary. Left ovary Measurements: 3.2 x 2.2 x 2.5 cm = volume: 9.3 mL. Normal appearance/no adnexal mass. Dominant simple follicle noted measuring 1.9 cm Pulsed Doppler evaluation of both ovaries demonstrates normal low-resistance arterial and venous waveforms. Other findings There is a small volume of anechoic pelvic cul-de-sac fluid, nonspecific. IMPRESSION: Negative ultrasound.  Unchanged. Electronically Signed   By: Almira Bar M.D.   On: 06/15/2022 06:57     PROCEDURES:  Critical Care performed:  No     Procedures    IMPRESSION / MDM / ASSESSMENT AND PLAN / ED COURSE  I reviewed the triage vital signs and the nursing notes.    Patient here with complaints of pelvic pain, nausea and vomiting.    DIFFERENTIAL DIAGNOSIS (includes but not limited to):   Ovarian cyst, torsion, TOA, pregnancy, ectopic, UTI, appendicitis, diverticulitis, colitis   Patient's presentation is most consistent with acute presentation with potential threat to life or bodily function.   PLAN: We will obtain CBC, CMP, lipase, urinalysis, urine pregnancy test, transvaginal ultrasound with Doppler.  We will give pain medication, IV fluids, nausea medicine.  We will keep n.p.o.   MEDICATIONS GIVEN IN ED: Medications  ketorolac (TORADOL) 30 MG/ML injection 30 mg (30 mg Intravenous Given 06/15/22 0445)  ondansetron (ZOFRAN) injection 4 mg (4 mg Intravenous Given 06/15/22 0446)  sodium chloride 0.9 % bolus 1,000 mL (1,000 mLs Intravenous New Bag/Given 06/15/22 0500)  morphine (PF) 4 MG/ML injection 4 mg (4 mg Intravenous Given 06/15/22 0446)  iohexol (OMNIPAQUE) 300 MG/ML solution 100 mL (100 mLs Intravenous Contrast Given 06/15/22 0725)     ED COURSE: Patient's labs show no leukocytosis.  Normal electrolytes, LFTs and lipase.  Creatinine minimally elevated at 1.08.  She is getting IV fluids.  Urine shows no sign of infection.  Pregnancy test is negative.   Patient's ultrasound reviewed and interpreted by myself and the radiologist and shows no torsion or ovarian cyst.  We will proceed with CT of the abdomen pelvis to rule out appendicitis.  Patient and mother updated with plan.  Pain currently well controlled after 1 dose of morphine.   CT abdomen pelvis reviewed and interpreted by myself and the radiologist and shows no appendicitis or other acute abnormality.  Will p.o. challenge.  If patient continues to do well, will discharge home with ibuprofen, Bentyl, Zofran.  She has a PCP for close  follow-up.   At this time, I do not feel there is any life-threatening condition present. I reviewed all nursing notes, vitals, pertinent previous records.  All lab and urine results, EKGs, imaging ordered have been independently reviewed and interpreted by myself.  I reviewed all available radiology reports from any imaging ordered this visit.  Based on my assessment, I feel the patient is safe to be discharged home without further emergent workup and can continue workup as an outpatient as needed. Discussed all findings, treatment plan as well as usual and customary return precautions.  They verbalize understanding and are comfortable with this plan.  Outpatient follow-up has been provided as needed.  All questions have been answered.   CONSULTS: Admission considered but imaging shows no acute abnormality and patient tolerating p.o.   OUTSIDE RECORDS REVIEWED: Reviewed patient's last family medicine note at [facility] on 05/18/2021.       FINAL CLINICAL IMPRESSION(S) / ED DIAGNOSES   Final diagnoses:  Lower abdominal pain, unspecified  Nausea and vomiting in adult     Rx / DC Orders   ED Discharge Orders          Ordered    dicyclomine (BENTYL) 20 MG tablet  Every 8 hours PRN        06/15/22 0747    ibuprofen (ADVIL) 800 MG tablet  Every 8 hours PRN        06/15/22 0747    ondansetron (ZOFRAN-ODT) 4 MG disintegrating tablet  Every 6 hours PRN        06/15/22 0747             Note:  This document was prepared using Dragon voice recognition software and may include unintentional dictation errors.   Andriana Casa, Layla Maw, DO 06/15/22 0720    Margorie Renner, Layla Maw, DO 06/15/22 [phone]

## 2022-06-15 NOTE — ED Notes (Signed)
Patient transported to Ultrasound 

## 2022-06-15 NOTE — ED Notes (Signed)
Pt back from [place]. Pt placed on monitors and vitals obtained. Pt is relaxing and awaiting [place] report.

## 2022-12-01 ENCOUNTER — Emergency Department: Payer: Self-pay

## 2022-12-01 ENCOUNTER — Other Ambulatory Visit: Payer: Self-pay

## 2022-12-01 ENCOUNTER — Emergency Department
Admission: EM | Admit: 2022-12-01 | Discharge: 2022-12-01 | Disposition: A | Discharge status: Home / Self Care | Payer: Self-pay | Attending: Emergency Medicine | Admitting: Emergency Medicine

## 2022-12-01 DIAGNOSIS — M25811 Other specified joint disorders, right shoulder: Secondary | ICD-10-CM | POA: Insufficient documentation

## 2022-12-01 MED ORDER — MELOXICAM 15 MG PO TABS: 15.0000 mg | ORAL_TABLET | Freq: Every day | ORAL | 0 refills | Status: AC

## 2022-12-01 MED ORDER — KETOROLAC TROMETHAMINE 30 MG/ML IJ SOLN
30.0000 mg | Freq: Once | INTRAMUSCULAR | Status: AC
Start: 2022-12-01 — End: 2022-12-01
  Administered 2022-12-01: 30 mg via INTRAMUSCULAR
  Filled 2022-12-01: qty 1

## 2022-12-01 NOTE — ED Triage Notes (Signed)
Pt in with pain and decreased ROM to RUE since waking this morning. She states the pain starts in shoulder and is also present to R bicep. Full ROM past the elbow. Arrives in homemade sling to RUE. Denies any known injuries

## 2022-12-01 NOTE — ED Provider Notes (Signed)
[facility] Provider Note  Patient Contact: 9:16 PM (approximate)   History   Arm Pain   HPI  Katherine Wong is a 22 y.o. female who presents emergency department complaining of right shoulder pain.  Patient has nontraumatic right shoulder pain that fits with the anterior and posterior aspect of the shoulder.  She states that it sometimes radiates up into the neck and sometimes down her arm.  Does not past the elbow.  No numbness or tingling of the hand.  Again no known injury.  No history of chronic shoulder problems.  No history of neck pain.     Physical Exam   Triage Vital Signs: ED Triage Vitals  Enc Vitals Group     BP 12/01/22 1908 (!) 125/59     Pulse Rate 12/01/22 1908 73     Resp 12/01/22 1908 17     Temp 12/01/22 1908 98 F (36.7 C)     Temp Source 12/01/22 1908 Oral     SpO2 12/01/22 1908 98 %     Weight 12/01/22 1910 200 lb (90.7 kg)     Height --      Head Circumference --      Peak Flow --      Pain Score 12/01/22 1909 9     Pain Loc --      Pain Edu? --      Excl. in [place]? --     Most recent vital signs: Vitals:   12/01/22 1908  BP: (!) 125/59  Pulse: 73  Resp: 17  Temp: 98 F (36.7 C)  SpO2: 98%     General: Alert and in no acute distress.  Neck: No stridor. No cervical spine tenderness to palpation.  No tenderness along the right paraspinal muscle group.  Cardiovascular:  Good peripheral perfusion Respiratory: Normal respiratory effort without tachypnea or retractions. Lungs CTAB.  Musculoskeletal: Full range of motion to all extremities.  Limited range of motion actively due to pain.  Good passive range of motion.  Patient has slight tenderness along the proximal biceps and over the rotator cuff but is very tender over the [facility] joint.  No palpable abnormality.  Radial pulses sensation intact distally. Neurologic:  No gross focal neurologic deficits are appreciated.  Skin:   No rash noted Other:   ED Results /  Procedures / Treatments   Labs (all labs ordered are listed, but only abnormal results are displayed) Labs Reviewed - No data to display   EKG     RADIOLOGY  I personally viewed, evaluated, and interpreted these images as part of my medical decision making, as well as reviewing the written report by the radiologist.  ED Provider Interpretation: No acute findings on x-rays of the shoulder or humerus  DG Humerus Right  Result Date: 12/01/2022 CLINICAL DATA:  Right shoulder pain EXAM: RIGHT HUMERUS - 2+ VIEW COMPARISON:  None Available. FINDINGS: There is no evidence of fracture or other focal bone lesions. Soft tissues are unremarkable. IMPRESSION: Negative. Electronically Signed   By: Rolm Baptise M.D.   On: 12/01/2022 20:05   DG Shoulder Right  Result Date: 12/01/2022 CLINICAL DATA:  Right shoulder pain EXAM: RIGHT SHOULDER - 2+ VIEW COMPARISON:  12/01/2022 FINDINGS: There is no evidence of fracture or dislocation. There is no evidence of arthropathy or other focal bone abnormality. Soft tissues are unremarkable. IMPRESSION: Negative. Electronically Signed   By: Rolm Baptise M.D.   On: 12/01/2022 20:04    PROCEDURES:  Critical Care performed: No  Procedures   MEDICATIONS ORDERED IN ED: Medications  ketorolac (TORADOL) 30 MG/ML injection 30 mg (has no administration in time range)     IMPRESSION / MDM / ASSESSMENT AND PLAN / ED COURSE  I reviewed the triage vital signs and the nursing notes.                                 Differential diagnosis includes, but is not limited to, impingement syndrome, rotator cuff tear, biceps tear, biceps tendinitis, shoulder separation, shoulder dislocation  Patient's presentation is most consistent with acute presentation with potential threat to life or bodily function.   Patient's diagnosis is consistent with impingement of the shoulder.  Patient presents emergency department with pain in the right shoulder sometimes radiating into  the neck and down the right arm.  There is no known injury.  Imaging was reassuring with no acute traumatic findings.  Patient was tender over the [facility] joint which reproduces patient's symptoms.  Suspect impingement at this time.  Anti-inflammatories will be prescribed for the patient.  Patient will have a sling though she is encouraged to use this as minimally as possible.  She is encouraged to use pendulum exercises to keep good mobility of the shoulder.  If symptoms do not improve with conservative treatment follow-up with orthopedics.  Return precautions discussed with the patient..  Patient is given ED precautions to return to the ED for any worsening or new symptoms.     FINAL CLINICAL IMPRESSION(S) / ED DIAGNOSES   Final diagnoses:  Impingement of right shoulder     Rx / DC Orders   ED Discharge Orders          Ordered    meloxicam (MOBIC) 15 MG tablet  Daily        12/01/22 2119             Note:  This document was prepared using Dragon voice recognition software and may include unintentional dictation errors.   Brynda Peon 12/01/22 2120    Blake Divine, MD 12/02/22 [phone]

## 2023-02-06 ENCOUNTER — Emergency Department
Admission: EM | Admit: 2023-02-06 | Discharge: 2023-02-06 | Disposition: A | Discharge status: Home / Self Care | Payer: Self-pay | Attending: Emergency Medicine | Admitting: Emergency Medicine

## 2023-02-06 ENCOUNTER — Encounter: Payer: Self-pay | Admitting: Emergency Medicine

## 2023-02-06 ENCOUNTER — Other Ambulatory Visit: Payer: Self-pay

## 2023-02-06 DIAGNOSIS — J45909 Unspecified asthma, uncomplicated: Secondary | ICD-10-CM | POA: Insufficient documentation

## 2023-02-06 DIAGNOSIS — Z20822 Contact with and (suspected) exposure to covid-19: Secondary | ICD-10-CM | POA: Insufficient documentation

## 2023-02-06 DIAGNOSIS — K529 Noninfective gastroenteritis and colitis, unspecified: Secondary | ICD-10-CM | POA: Insufficient documentation

## 2023-02-06 LAB — COMPREHENSIVE METABOLIC PANEL
ALT: 17 U/L (ref 0–44)
AST: 19 U/L (ref 15–41)
Albumin: 4.7 g/dL (ref 3.5–5.0)
Alkaline Phosphatase: 53 U/L (ref 38–126)
Anion gap: 4 — ABNORMAL LOW (ref 5–15)
BUN: 10 mg/dL (ref 6–20)
CO2: 25 mmol/L (ref 22–32)
Calcium: 9.1 mg/dL (ref 8.9–10.3)
Chloride: 106 mmol/L (ref 98–111)
Creatinine, Ser: 1.01 mg/dL — ABNORMAL HIGH (ref 0.44–1.00)
GFR, Estimated: >60 mL/min (ref >60)
Glucose, Bld: 95 mg/dL (ref 70–99)
Potassium: 4.1 mmol/L (ref 3.5–5.1)
Sodium: 135 mmol/L (ref 135–145)
Total Bilirubin: 0.8 mg/dL (ref 0.3–1.2)
Total Protein: 8.2 g/dL — ABNORMAL HIGH (ref 6.5–8.1)

## 2023-02-06 LAB — CBC
HCT: 40.9 % (ref 36.0–46.0)
Hemoglobin: 13.5 g/dL (ref 12.0–15.0)
MCH: 32.4 pg (ref 26.0–34.0)
MCHC: 33 g/dL (ref 30.0–36.0)
MCV: 98.1 fL (ref 80.0–100.0)
Platelets: 305 10*3/uL (ref 150–400)
RBC: 4.17 MIL/uL (ref 3.87–5.11)
RDW: 13.9 % (ref 11.5–15.5)
WBC: 7.6 10*3/uL (ref 4.0–10.5)
nRBC: 0 % (ref 0.0–0.2)

## 2023-02-06 LAB — LIPASE, BLOOD: Lipase: 29 U/L (ref 11–51)

## 2023-02-06 LAB — RESP PANEL BY RT-PCR (RSV, FLU A&B, COVID)  RVPGX2
Influenza A by PCR: NEGATIVE
Influenza B by PCR: NEGATIVE
Resp Syncytial Virus by PCR: NEGATIVE
SARS Coronavirus 2 by RT PCR: NEGATIVE

## 2023-02-06 LAB — URINALYSIS, ROUTINE W REFLEX MICROSCOPIC
Bilirubin Urine: NEGATIVE
Glucose, UA: NEGATIVE mg/dL
Hgb urine dipstick: NEGATIVE
Ketones, ur: 20 mg/dL — AB
Leukocytes,Ua: NEGATIVE
Nitrite: NEGATIVE
Protein, ur: 30 mg/dL — AB
Specific Gravity, Urine: 1.03 (ref 1.005–1.030)
pH: 6 (ref 5.0–8.0)

## 2023-02-06 LAB — PREGNANCY, URINE: Preg Test, Ur: NEGATIVE

## 2023-02-06 MED ORDER — ONDANSETRON 4 MG PO TBDP
4.0000 mg | ORAL_TABLET | Freq: Once | ORAL | Status: AC
  Administered 2023-02-06: 4 mg via ORAL
  Filled 2023-02-06: qty 1

## 2023-02-06 NOTE — ED Provider Notes (Signed)
[facility] Provider Note    Event Date/Time   First MD Initiated Contact with Patient 02/06/23 1520     (approximate)   History   Emesis   HPI  Katherine Wong is a 22 y.o. female with no stated past medical history presents with nausea vomiting and diarrhea.  Symptoms started today.  She has had 2 episodes of vomiting and multiple episodes of diarrhea that is nonbloody.  She has some associated periumbilical abdominal cramping.  Denies fevers or chills.  She is tolerating p.o. has been keeping liquids down.  Denies urinary symptoms vaginal bleeding.  She has had some nasal congestion and cough for about a week denies dyspnea.  Her mom was diagnosed with norovirus several days ago.     Past Medical History:  Diagnosis Date   Asthma    as a child   Social anxiety disorder 02/23/2016    Patient Active Problem List   Diagnosis Date Noted   Social anxiety disorder 02/23/2016   MDD (major depressive disorder), recurrent episode, severe ([place]) 02/22/2016   Right ovarian cyst 10/04/2015     Physical Exam  Triage Vital Signs: ED Triage Vitals [02/06/23 1311]  Enc Vitals Group     BP 125/75     Pulse Rate 76     Resp 18     Temp 98.6 F (37 C)     Temp Source Oral     SpO2 97 %     Weight      Height      Head Circumference      Peak Flow      Pain Score 10     Pain Loc      Pain Edu?      Excl. in [place]?     Most recent vital signs: Vitals:   02/06/23 1311  BP: 125/75  Pulse: 76  Resp: 18  Temp: 98.6 F (37 C)  SpO2: 97%    General: Awake, no distress.  CV:  Good peripheral perfusion.  Resp:  Normal effort.  Abd:  No distention. Soft, nontender throughout Neuro:             Awake, Alert, Oriented x 3  Other:     ED Results / Procedures / Treatments  Labs (all labs ordered are listed, but only abnormal results are displayed) Labs Reviewed  COMPREHENSIVE METABOLIC PANEL - Abnormal; Notable for the following components:       Result Value   Creatinine, Ser 1.01 (*)    Total Protein 8.2 (*)    Anion gap 4 (*)    All other components within normal limits  URINALYSIS, ROUTINE W REFLEX MICROSCOPIC - Abnormal; Notable for the following components:   Color, Urine YELLOW (*)    APPearance HAZY (*)    Ketones, ur 20 (*)    Protein, ur 30 (*)    Bacteria, UA RARE (*)    All other components within normal limits  RESP PANEL BY RT-PCR (RSV, FLU A&B, COVID)  [id number]  GASTROINTESTINAL PANEL BY PCR, STOOL (REPLACES STOOL CULTURE)  LIPASE, BLOOD  CBC  PREGNANCY, URINE  POC URINE PREG, ED     EKG    RADIOLOGY    PROCEDURES:  Critical Care performed: No  Procedures    MEDICATIONS ORDERED IN ED: Medications  ondansetron (ZOFRAN-ODT) disintegrating tablet 4 mg (4 mg Oral Given 02/06/23 1545)     IMPRESSION / MDM / ASSESSMENT AND PLAN / ED COURSE  I reviewed the triage vital signs and the nursing notes.                              Patient's presentation is most consistent with acute complicated illness / injury requiring diagnostic workup.  Differential diagnosis includes, but is not limited to, viral gastroenteritis less likely appendicitis diverticulitis colitis  Patient is a 22 year old female presents with vomiting and diarrhea that started today.  She is also had some cough and congestion for the last week or so.  Her mom was recently diagnosed with norovirus.  Patient primarily comes today because she wants to be tested for work purposes.  She does endorse some cramping abdominal pain in the periumbilical region.  No blood in the stool.  No fevers.  She has vomited twice today and is still tolerating p.o.  Patient's vitals are reassuring overall she looks well abdominal exam is benign I have low suspicion for acute abdominal process suspect this is likely viral especially with her exposure to norovirus.  Labs obtained overall reassuring.  Creatinine is 1 but this is near baseline no elevation of BUN.   UA not consistent with infection pregnancy test negative.  I did test COVID and flu these are negative.  Ordered GI PCR given patient's interest in the test but do not feel like this will change management she was not able to have an episode of diarrhea in the ED.  I did discuss with her IV hydration versus oral hydration after ODT Zofran she preferred the oral route given she is tolerating p.o. I agree. Patient was given ODT Zofran and tolerating p.o. in the ED.  Will discharge.  Work note provided.        FINAL CLINICAL IMPRESSION(S) / ED DIAGNOSES   Final diagnoses:  Gastroenteritis     Rx / DC Orders   ED Discharge Orders     None        Note:  This document was prepared using Dragon voice recognition software and may include unintentional dictation errors.   Rada Hay, MD 02/06/23 1726

## 2023-02-06 NOTE — Discharge Instructions (Addendum)
Your COVID and flu test was negative.  You likely do have norovirus as well.  Please make sure you are staying hydrated.  You can stick to clear liquids while you are still feeling sick.  As you are feeling improved you can advance her diet to bland solids.  If you develop worsening abdominal pain specifically pain in the right side of your abdomen you are not able to keep fluids down please return to the emergency department.

## 2023-02-06 NOTE — ED Triage Notes (Signed)
Patient to ED via POV for vomiting and emesis that started this AM. States her mother has norovirus.

## 2023-08-28 ENCOUNTER — Emergency Department: Payer: MEDICAID

## 2023-08-28 ENCOUNTER — Encounter: Payer: Self-pay | Admitting: Medical Oncology

## 2023-08-28 ENCOUNTER — Emergency Department
Admission: EM | Admit: 2023-08-28 | Discharge: 2023-08-28 | Disposition: A | Discharge status: Home / Self Care | Payer: MEDICAID | Attending: Emergency Medicine | Admitting: Emergency Medicine

## 2023-08-28 DIAGNOSIS — R112 Nausea with vomiting, unspecified: Secondary | ICD-10-CM | POA: Diagnosis present

## 2023-08-28 DIAGNOSIS — E860 Dehydration: Secondary | ICD-10-CM | POA: Insufficient documentation

## 2023-08-28 DIAGNOSIS — R197 Diarrhea, unspecified: Secondary | ICD-10-CM | POA: Insufficient documentation

## 2023-08-28 DIAGNOSIS — D72829 Elevated white blood cell count, unspecified: Secondary | ICD-10-CM | POA: Insufficient documentation

## 2023-08-28 LAB — GASTROINTESTINAL PANEL BY PCR, STOOL (REPLACES STOOL CULTURE)

## 2023-08-28 LAB — COMPREHENSIVE METABOLIC PANEL
ALT: 18 U/L (ref 0–44)
AST: 19 U/L (ref 15–41)
Albumin: 4.2 g/dL (ref 3.5–5.0)
Alkaline Phosphatase: 48 U/L (ref 38–126)
Anion gap: 10 (ref 5–15)
BUN: 8 mg/dL (ref 6–20)
CO2: 21 mmol/L — ABNORMAL LOW (ref 22–32)
Calcium: 8.7 mg/dL — ABNORMAL LOW (ref 8.9–10.3)
Chloride: 106 mmol/L (ref 98–111)
Creatinine, Ser: 0.86 mg/dL (ref 0.44–1.00)
GFR, Estimated: >60 mL/min (ref >60)
Glucose, Bld: 85 mg/dL (ref 70–99)
Potassium: 3.9 mmol/L (ref 3.5–5.1)
Sodium: 137 mmol/L (ref 135–145)
Total Bilirubin: 0.8 mg/dL (ref 0.3–1.2)
Total Protein: 7.2 g/dL (ref 6.5–8.1)

## 2023-08-28 LAB — LIPASE, BLOOD: Lipase: 28 U/L (ref 11–51)

## 2023-08-28 LAB — CBC
HCT: 41.3 % (ref 36.0–46.0)
Hemoglobin: 14.1 g/dL (ref 12.0–15.0)
MCH: 30.5 pg (ref 26.0–34.0)
MCHC: 34.1 g/dL (ref 30.0–36.0)
MCV: 89.4 fL (ref 80.0–100.0)
Platelets: 269 10*3/uL (ref 150–400)
RBC: 4.62 MIL/uL (ref 3.87–5.11)
RDW: 12.6 % (ref 11.5–15.5)
WBC: 16 10*3/uL — ABNORMAL HIGH (ref 4.0–10.5)
nRBC: 0 % (ref 0.0–0.2)

## 2023-08-28 LAB — POC URINE PREG, ED: Preg Test, Ur: NEGATIVE

## 2023-08-28 LAB — MAGNESIUM: Magnesium: 1.7 mg/dL (ref 1.7–2.4)

## 2023-08-28 MED ORDER — DICYCLOMINE HCL 10 MG PO CAPS
10.0000 mg | ORAL_CAPSULE | Freq: Once | ORAL | Status: AC
  Administered 2023-08-28: 10 mg via ORAL
  Filled 2023-08-28: qty 1

## 2023-08-28 MED ORDER — KETOROLAC TROMETHAMINE 30 MG/ML IJ SOLN
15.0000 mg | Freq: Once | INTRAMUSCULAR | Status: AC
  Administered 2023-08-28: 15 mg via INTRAVENOUS
  Filled 2023-08-28: qty 1

## 2023-08-28 MED ORDER — ONDANSETRON HCL 4 MG/2ML IJ SOLN
4.0000 mg | INTRAMUSCULAR | Status: AC
  Administered 2023-08-28: 4 mg via INTRAVENOUS
  Filled 2023-08-28: qty 2

## 2023-08-28 MED ORDER — IOHEXOL 300 MG/ML  SOLN
100.0000 mL | Freq: Once | INTRAMUSCULAR | Status: AC | PRN
  Administered 2023-08-28: 100 mL via INTRAVENOUS

## 2023-08-28 MED ORDER — ONDANSETRON 4 MG PO TBDP
4.0000 mg | ORAL_TABLET | Freq: Four times a day (QID) | ORAL | 0 refills | Status: AC | PRN
Start: 2023-08-28 — End: ?

## 2023-08-28 MED ORDER — ACETAMINOPHEN 500 MG PO TABS
1000.0000 mg | ORAL_TABLET | ORAL | Status: AC
  Administered 2023-08-28: 1000 mg via ORAL
  Filled 2023-08-28: qty 2

## 2023-08-28 MED ORDER — SODIUM CHLORIDE 0.9 % IV BOLUS
1000.0000 mL | Freq: Once | INTRAVENOUS | Status: AC
  Administered 2023-08-28: 1000 mL via INTRAVENOUS

## 2023-08-28 MED ORDER — DICYCLOMINE HCL 10 MG PO CAPS: 10.0000 mg | ORAL_CAPSULE | Freq: Three times a day (TID) | ORAL | 0 refills | Status: AC | PRN

## 2023-08-28 NOTE — ED Notes (Signed)
Lab was called regarding the pt's CMP/lipase levels, which this RN redrew.  Lab states that they are working on it at this time. Dr. Fanny Bien made aware.

## 2023-08-28 NOTE — ED Notes (Signed)
Gave pt po water, so far tolerating. No signs of vomiting at this time--she does state that she feels better but still cramping

## 2023-08-28 NOTE — ED Provider Notes (Signed)
[facility] Provider Note    Event Date/Time   First MD Initiated Contact with Patient 08/28/23 [phone]     (approximate)   History   Generalized Body Aches, Nausea, Emesis, and Diarrhea   HPI  Katherine Wong is a 22 y.o. female 3 AM suddenly started having nausea and vomiting  She reports is severe.  She has been having yellow vomitus and multiple rounds of loose uncontrollable watery stool.  Reports she has vomited and had diarrhea to the point that she has nothing left in her.  Still with nausea and some dry heaving but no longer having liquid emesis or diarrhea  Child in the house had similar symptoms yesterday but have now resolved.  Patient denies fevers.  No chest pain or shortness of breath.  No headache.  No fever.  Denies pregnancy, last menstrual cycle was just within the last 2 weeks.  Roughly around October 2.  No vaginal discharge or pelvic pain.      Physical Exam   Triage Vital Signs: ED Triage Vitals  Encounter Vitals Group     BP 08/28/23 0805 136/79     Systolic BP Percentile --      Diastolic BP Percentile --      Pulse Rate 08/28/23 0805 (!) 50     Resp 08/28/23 0805 16     Temp 08/28/23 0805 97.7 F (36.5 C)     Temp Source 08/28/23 0805 Oral     SpO2 08/28/23 0805 100 %     Weight 08/28/23 0806 217 lb (98.4 kg)     Height 08/28/23 0806 5\' 3"  (1.6 m)     Head Circumference --      Peak Flow --      Pain Score 08/28/23 0806 10     Pain Loc --      Pain Education --      Exclude from Growth Chart --     Most recent vital signs: Vitals:   08/28/23 1300 08/28/23 1316  BP:  128/78  Pulse: (!) 50   Resp: 13   Temp:    SpO2: 100%      General: Awake, does not appear acutely toxic but is dry heaving, appears fatigued, reports stomach cramping and pain CV:  Good peripheral perfusion.  Normal tones and rate Resp:  Normal effort.  Normal respirations clear bilateral Mucous membranes are dry.  Scant amount of dried  emesis noted on teeth. Abd:  No distention.  Soft but reporting moderate tenderness to palpation in all quadrants without obvious focality.  Patient reports that it is a crampy discomfort.  There is no rebound or guarding Other:     ED Results / Procedures / Treatments   Labs (all labs ordered are listed, but only abnormal results are displayed) Labs Reviewed  CBC - Abnormal; Notable for the following components:      Result Value   WBC 16.0 (*)    All other components within normal limits  COMPREHENSIVE METABOLIC PANEL - Abnormal; Notable for the following components:   CO2 21 (*)    Calcium 8.7 (*)    All other components within normal limits  GASTROINTESTINAL PANEL BY PCR, STOOL (REPLACES STOOL CULTURE)  MAGNESIUM  LIPASE, BLOOD  POC URINE PREG, ED   Comprehensive with mild hypocalcemia, unlikely related   RADIOLOGY  CT abdomen pelvis interpreted by me as negative for gross finding of abnormality  CT ABDOMEN PELVIS W CONTRAST  Result Date: 08/28/2023  CLINICAL DATA:  Bowel obstruction suspected await HCG, pending EXAM: CT ABDOMEN AND PELVIS WITH CONTRAST TECHNIQUE: Multidetector CT imaging of the abdomen and pelvis was performed using the standard protocol following bolus administration of intravenous contrast. RADIATION DOSE REDUCTION: This exam was performed according to the departmental dose-optimization program which includes automated exposure control, adjustment of the mA and/or kV according to patient size and/or use of iterative reconstruction technique. CONTRAST:  OMNIPAQUE IOHEXOL 300 MG/ML  SOLN COMPARISON:  CT AP, 06/15/2022 and 08/18/2019. FINDINGS: Lower chest: No acute abnormality. Hepatobiliary: No focal liver abnormality is seen. No gallstones, gallbladder wall thickening, or biliary dilatation. Pancreas: No pancreatic ductal dilatation or surrounding inflammatory changes. Spleen: Normal in size without focal abnormality. Adrenals/Urinary Tract: Adrenal glands  are unremarkable. Kidneys are normal, without renal calculi, focal lesion, or hydronephrosis. Bladder is unremarkable. Stomach/Bowel: Stomach is within normal limits. Appendix appears normal. No evidence of bowel wall thickening, distention, or inflammatory changes. Vascular/Lymphatic: No significant vascular findings are present. No enlarged abdominal or pelvic lymph nodes. Reproductive: Prominent RIGHT ovarian follicles. Uterus and adnexa are otherwise unremarkable. Other: No abdominal wall hernia or abnormality. No abdominopelvic ascites. Musculoskeletal: No acute or significant osseous findings. IMPRESSION: No acute abdominopelvic process. Electronically Signed   By: Roanna Banning M.D.   On: 08/28/2023 13:47      PROCEDURES:  Critical Care performed: No  Procedures   MEDICATIONS ORDERED IN ED: Medications  sodium chloride 0.9 % bolus 1,000 mL (0 mLs Intravenous Stopped 08/28/23 1100)  ondansetron (ZOFRAN) injection 4 mg (4 mg Intravenous Given 08/28/23 0919)  ketorolac (TORADOL) 30 MG/ML injection 15 mg (15 mg Intravenous Given 08/28/23 1123)  dicyclomine (BENTYL) capsule 10 mg (10 mg Oral Given 08/28/23 1119)  acetaminophen (TYLENOL) tablet 1,000 mg (1,000 mg Oral Given 08/28/23 1119)  iohexol (OMNIPAQUE) 300 MG/ML solution 100 mL (100 mLs Intravenous Contrast Given 08/28/23 1228)     IMPRESSION / MDM / ASSESSMENT AND PLAN / ED COURSE  I reviewed the triage vital signs and the nursing notes.                              Differential diagnosis includes, but is not limited to, possible acute self-limited gastroenteritis, food poisoning, also given that she continues to experience pain and symptoms after antiemetic and fluids consideration for other acute intra-abdominal process as well as her elevated white count we will proceed with CT imaging.  She does report her emesis is notably improved after Zofran.  Denies pregnancy.  Will provide Toradol, Bentyl, and proceed with CT to evaluate for  other causes such as colitis, diverticulitis, cholelithiasis cholecystitis pancreatitis, renal stones etc.  She does not have any symptoms that localize clearly to any 1 quadrant.  There is no focally tender right upper quadrant or right lower quadrant.  No neurologic pulmonary or vascular symptoms.  Warm well-perfused.  After receiving IV fluid blood pressures improved resting comfortably with still ongoing pain and crampy discomfort though but no further large-volume stooling or bowel movement  Patient's presentation is most consistent with acute complicated illness / injury requiring diagnostic workup.     Pregnancy test negative   ----------------------------------------- 10:52 AM on 08/28/2023 ----------------------------------------- Patient resting more comfortably.  Vomiting has resolved.  She is now hydrated.  She advises that she still having quite a bit of abdominal pain, it seems to be diffuse without obvious focality but with her significantly elevated white count and thus far  negative stool study, we will proceed with CT imaging to further evaluate as to cause.  Urine pregnancy test is pending, given delay will add on hCG.  Patient denies pregnancy, currently reports last menstrual cycle starting just a couple weeks ago.  Will give Toradol and Bentyl for pain   Patient feeling much improved upon reassessment.  No further emesis or diarrhea.  Discussed with patient and family, comfortable plan for careful return precautions.  Conservative management, oral hydration.  Bentyl and Zofran which she has used with success in the past when she had norovirus.  Today suspect acute self-limited GI illness with reassuring workup in the ER.  Careful return precautions advised.  Patient family bring her home  FINAL CLINICAL IMPRESSION(S) / ED DIAGNOSES   Final diagnoses:  Dehydration  Nausea vomiting and diarrhea     Rx / DC Orders   ED Discharge Orders          Ordered     ondansetron (ZOFRAN-ODT) 4 MG disintegrating tablet  Every 6 hours PRN        08/28/23 1407    dicyclomine (BENTYL) 10 MG capsule  3 times daily PRN        08/28/23 1407             Note:  This document was prepared using Dragon voice recognition software and may include unintentional dictation errors.   Sharyn Creamer, MD 08/28/23 2148

## 2023-08-28 NOTE — Discharge Instructions (Addendum)
You were seen in the emergency room for abdominal pain. It is important that you follow up closely with your primary care doctor in the next couple of days. ° ° °Please return to the emergency room right away if you are to develop a fever, severe nausea, your pain becomes severe or worsens, you are unable to keep food down, begin vomiting any dark or bloody fluid, you develop any dark or bloody stools, feel dehydrated, or other new concerns or symptoms arise. ° ° °

## 2023-08-28 NOTE — ED Triage Notes (Signed)
Pt to ED via [facility] with reports of waking up this am with generalized body aches, NVD. Pt denies fever.

## 2023-10-23 ENCOUNTER — Encounter: Payer: Self-pay | Admitting: Emergency Medicine

## 2023-10-23 ENCOUNTER — Emergency Department: Payer: MEDICAID

## 2023-10-23 ENCOUNTER — Emergency Department
Admission: EM | Admit: 2023-10-23 | Discharge: 2023-10-23 | Disposition: A | Discharge status: Home / Self Care | Payer: MEDICAID | Attending: Student in an Organized Health Care Education/Training Program | Admitting: Student in an Organized Health Care Education/Training Program

## 2023-10-23 ENCOUNTER — Other Ambulatory Visit: Payer: Self-pay

## 2023-10-23 DIAGNOSIS — R051 Acute cough: Secondary | ICD-10-CM | POA: Insufficient documentation

## 2023-10-23 DIAGNOSIS — R5381 Other malaise: Secondary | ICD-10-CM | POA: Insufficient documentation

## 2023-10-23 DIAGNOSIS — J45909 Unspecified asthma, uncomplicated: Secondary | ICD-10-CM | POA: Insufficient documentation

## 2023-10-23 DIAGNOSIS — M791 Myalgia, unspecified site: Secondary | ICD-10-CM | POA: Insufficient documentation

## 2023-10-23 DIAGNOSIS — Z20822 Contact with and (suspected) exposure to covid-19: Secondary | ICD-10-CM | POA: Insufficient documentation

## 2023-10-23 LAB — RESP PANEL BY RT-PCR (RSV, FLU A&B, COVID)  RVPGX2
Influenza A by PCR: NEGATIVE
Influenza B by PCR: NEGATIVE
Resp Syncytial Virus by PCR: NEGATIVE
SARS Coronavirus 2 by RT PCR: NEGATIVE

## 2023-10-23 MED ORDER — PREDNISONE 20 MG PO TABS: 40.0000 mg | ORAL_TABLET | Freq: Every day | ORAL | 0 refills | Status: AC

## 2023-10-23 MED ORDER — ALBUTEROL SULFATE HFA 108 (90 BASE) MCG/ACT IN AERS: 2.0000 | INHALATION_SPRAY | Freq: Four times a day (QID) | RESPIRATORY_TRACT | 2 refills | Status: AC | PRN

## 2023-10-23 MED ORDER — AZITHROMYCIN 250 MG PO TABS: ORAL_TABLET | ORAL | 0 refills | Status: AC

## 2023-10-23 NOTE — ED Provider Notes (Signed)
[facility] Provider Note    Event Date/Time   First MD Initiated Contact with Patient 10/23/23 1019     (approximate)   History   Cough   HPI  Katherine Wong is a 22 y.o. female who presents to the ER for evaluation of cough congestion general malaise myalgias since Tuesday.  Was around sick contacts this past week.  Does have a history of asthma but does not feel like she is having much significant wheezing currently.  Cough is nonproductive.     Physical Exam   Triage Vital Signs: ED Triage Vitals  Encounter Vitals Group     BP 10/23/23 1009 (!) 131/108     Systolic BP Percentile --      Diastolic BP Percentile --      Pulse Rate 10/23/23 1009 94     Resp 10/23/23 1009 18     Temp 10/23/23 1009 98.9 F (37.2 C)     Temp Source 10/23/23 1009 Oral     SpO2 10/23/23 1009 96 %     Weight 10/23/23 1008 215 lb (97.5 kg)     Height 10/23/23 1008 5\' 3"  (1.6 m)     Head Circumference --      Peak Flow --      Pain Score 10/23/23 1008 10     Pain Loc --      Pain Education --      Exclude from Growth Chart --     Most recent vital signs: Vitals:   10/23/23 1009  BP: (!) 131/108  Pulse: 94  Resp: 18  Temp: 98.9 F (37.2 C)  SpO2: 96%     Constitutional: Alert  Eyes: Conjunctivae are normal.  Head: Atraumatic. Nose: +++ congestion/rhinnorhea. Mouth/Throat: Mucous membranes are moist.   Neck: Painless ROM.  Cardiovascular:   Good peripheral circulation. Respiratory: Occasional scattered crackles.  No significant wheezing.  Good air movement throughout. Gastrointestinal: Soft and nontender.  Musculoskeletal:  no deformity Neurologic:  MAE spontaneously. No gross focal neurologic deficits are appreciated.  Skin:  Skin is warm, dry and intact. No rash noted. Psychiatric: Mood and affect are normal. Speech and behavior are normal.    ED Results / Procedures / Treatments   Labs (all labs ordered are listed, but only abnormal  results are displayed) Labs Reviewed  RESP PANEL BY RT-PCR (RSV, FLU A&B, COVID)  RVPGX2     EKG     RADIOLOGY Please see ED Course for my review and interpretation.  I personally reviewed all radiographic images ordered to evaluate for the above acute complaints and reviewed radiology reports and findings.  These findings were personally discussed with the patient.  Please see medical record for radiology report.    PROCEDURES:  Critical Care performed: No  Procedures   MEDICATIONS ORDERED IN ED: Medications - No data to display   IMPRESSION / MDM / ASSESSMENT AND PLAN / ED COURSE  I reviewed the triage vital signs and the nursing notes.                              Differential diagnosis includes, but is not limited to, URI, COVID, flu, pneumonia, URI  Patient presented to the ER for evaluation of symptoms as described above consistent with URI.  Check viral panel as well as chest x-ray to evaluate for the above differential.   Clinical Course as of 10/23/23 1159  Wed Oct 23, 2023  1153 X-ray on my review and interpretation does not show any evidence of consolidation or pneumothorax. [PR]  1158 Patient remains well-appearing in no acute distress.  She does appear stable and appropriate for outpatient follow-up.  Discussed signs and symptoms for which she should return to the ER. [PR]    Clinical Course User Index [PR] Willy Eddy, MD     FINAL CLINICAL IMPRESSION(S) / ED DIAGNOSES   Final diagnoses:  Acute cough     Rx / DC Orders   ED Discharge Orders          Ordered    azithromycin (ZITHROMAX Z-PAK) 250 MG tablet        10/23/23 1157    predniSONE (DELTASONE) 20 MG tablet  Daily        10/23/23 1157    albuterol (VENTOLIN HFA) 108 (90 Base) MCG/ACT inhaler  Every 6 hours PRN        10/23/23 1157             Note:  This document was prepared using Dragon voice recognition software and may include unintentional dictation errors.     Willy Eddy, MD 10/23/23 1159

## 2023-10-23 NOTE — ED Triage Notes (Signed)
Pt via POV from home. Pt c/o cough, fever, chills, sore throat, and nasal congestion since last Tuesday. Fever 102 reported last night, last dose was 0800 this AM. Pt is A&Ox4 and NAD, ambulatory to triage.

## 2023-10-23 NOTE — ED Notes (Signed)
Pt c/o cough, fever, and chills since last Tuesday. A&O x4

## 2023-10-31 ENCOUNTER — Encounter: Payer: Self-pay | Admitting: Emergency Medicine

## 2024-04-02 DIAGNOSIS — R519 Headache, unspecified: Secondary | ICD-10-CM | POA: Diagnosis not present

## 2024-05-18 DIAGNOSIS — G43911 Migraine, unspecified, intractable, with status migrainosus: Secondary | ICD-10-CM | POA: Diagnosis not present

## 2024-05-18 DIAGNOSIS — R110 Nausea: Secondary | ICD-10-CM | POA: Diagnosis not present

## 2024-05-18 DIAGNOSIS — R197 Diarrhea, unspecified: Secondary | ICD-10-CM | POA: Diagnosis not present

## 2024-06-15 DIAGNOSIS — W57XXXA Bitten or stung by nonvenomous insect and other nonvenomous arthropods, initial encounter: Secondary | ICD-10-CM | POA: Diagnosis not present

## 2024-06-15 DIAGNOSIS — L989 Disorder of the skin and subcutaneous tissue, unspecified: Secondary | ICD-10-CM | POA: Diagnosis not present

## 2024-07-08 DIAGNOSIS — G43909 Migraine, unspecified, not intractable, without status migrainosus: Secondary | ICD-10-CM | POA: Diagnosis not present

## 2024-12-12 ENCOUNTER — Other Ambulatory Visit: Payer: Self-pay

## 2024-12-12 ENCOUNTER — Emergency Department
Admission: EM | Admit: 2024-12-12 | Discharge: 2024-12-12 | Disposition: A | Discharge status: Home / Self Care | Attending: Emergency Medicine | Admitting: Emergency Medicine

## 2024-12-12 DIAGNOSIS — R0981 Nasal congestion: Secondary | ICD-10-CM | POA: Insufficient documentation

## 2024-12-12 DIAGNOSIS — M791 Myalgia, unspecified site: Secondary | ICD-10-CM | POA: Diagnosis not present

## 2024-12-12 DIAGNOSIS — R519 Headache, unspecified: Secondary | ICD-10-CM | POA: Insufficient documentation

## 2024-12-12 DIAGNOSIS — J111 Influenza due to unidentified influenza virus with other respiratory manifestations: Secondary | ICD-10-CM

## 2024-12-12 DIAGNOSIS — R110 Nausea: Secondary | ICD-10-CM | POA: Diagnosis not present

## 2024-12-12 LAB — RESP PANEL BY RT-PCR (RSV, FLU A&B, COVID)  RVPGX2
Influenza A by PCR: NEGATIVE
Influenza B by PCR: NEGATIVE
Resp Syncytial Virus by PCR: NEGATIVE
SARS Coronavirus 2 by RT PCR: NEGATIVE

## 2024-12-12 MED ORDER — KETOROLAC TROMETHAMINE 30 MG/ML IJ SOLN
15.0000 mg | Freq: Once | INTRAMUSCULAR | Status: AC
  Administered 2024-12-12: 15 mg via INTRAVENOUS
  Filled 2024-12-12: qty 1

## 2024-12-12 MED ORDER — DEXAMETHASONE SOD PHOSPHATE PF 10 MG/ML IJ SOLN
10.0000 mg | Freq: Once | INTRAMUSCULAR | Status: AC
  Administered 2024-12-12: 10 mg via INTRAVENOUS
  Filled 2024-12-12: qty 1

## 2024-12-12 MED ORDER — DIPHENHYDRAMINE HCL 50 MG/ML IJ SOLN
12.5000 mg | INTRAMUSCULAR | Status: AC
  Administered 2024-12-12: 12.5 mg via INTRAVENOUS
  Filled 2024-12-12: qty 1

## 2024-12-12 MED ORDER — SODIUM CHLORIDE 0.9 % IV BOLUS
500.0000 mL | Freq: Once | INTRAVENOUS | Status: AC
  Administered 2024-12-12: 500 mL via INTRAVENOUS

## 2024-12-12 MED ORDER — DROPERIDOL 2.5 MG/ML IJ SOLN
1.2500 mg | Freq: Once | INTRAMUSCULAR | Status: AC
  Administered 2024-12-12: 1.25 mg via INTRAVENOUS
  Filled 2024-12-12: qty 2

## 2024-12-12 NOTE — ED Provider Notes (Signed)
 "  [facility] Provider Note    Event Date/Time   First MD Initiated Contact with Patient 12/12/24 0205     (approximate)   History   Headache and Nasal Congestion   HPI Katherine Wong is a 24 y.o. female with no contributory chronic medical issues other than migraines, social anxiety disorder, major depressive disorder.  She presents for evaluation of about a week of headache, nasal congestion, body aches, and nausea.  She said that the symptoms have been going on for a while and she thought she was getting a little bit better but her head hurts all over.  She said that it feels like her migraines with generalized pain particular behind her eyes but it has been going on for longer than usual.  She does not take any medicine in particular such as Imitrex, but rather usually just takes Excedrin or similar over-the-counter remedies.  She is not having any numbness or weakness in her extremities.  Some visual changes that she says are consistent with other headaches.  No chest pain or shortness of breath.  She states she has had some fever and has a very runny nose.     Physical Exam   Triage Vital Signs: ED Triage Vitals  Encounter Vitals Group     BP 12/12/24 0144 125/85     Girls Systolic BP Percentile --      Girls Diastolic BP Percentile --      Boys Systolic BP Percentile --      Boys Diastolic BP Percentile --      Pulse Rate 12/12/24 0144 80     Resp 12/12/24 0144 (!) 21     Temp 12/12/24 0144 99 F (37.2 C)     Temp Source 12/12/24 0144 Oral     SpO2 12/12/24 0144 98 %     Weight 12/12/24 0149 117.9 kg (260 lb)     Height 12/12/24 0149 1.6 m (5' 3)     Head Circumference --      Peak Flow --      Pain Score 12/12/24 0148 9     Pain Loc --      Pain Education --      Exclude from Growth Chart --     Most recent vital signs: Vitals:   12/12/24 0144 12/12/24 0206  BP: 125/85 101/65  Pulse: 80 89  Resp: (!) 21 (!) 22  Temp: 99 F (37.2 C)  98.6 F (37 C)  SpO2: 98% 100%    General: Awake, alert, pleasant and conversant.  No obvious distress but looks uncomfortable from viral symptoms. HEENT: Substantial rhinorrhea, no conjunctivitis.  Pupils equal and reactive. CV:  Good peripheral perfusion.   Resp:  Normal effort. Speaking easily and comfortably, no accessory muscle usage nor intercostal retractions.   Abd:  No distention.    ED Results / Procedures / Treatments   Labs (all labs ordered are listed, but only abnormal results are displayed) Labs Reviewed  RESP PANEL BY RT-PCR (RSV, FLU A&B, COVID)  [id number]      PROCEDURES:  Critical Care performed: No  Procedures    IMPRESSION / MDM / ASSESSMENT AND PLAN / ED COURSE  I reviewed the triage vital signs and the nursing notes.                              Differential diagnosis includes, but is not limited to, migraine,  viral illness, pneumonia or other bacterial infection  Patient's presentation is most consistent with acute presentation with potential threat to life or bodily function.  Labs/studies ordered (see ED course for additional labs and studies that may have been added later): Respiratory viral panel  Interventions/Medications given:  Medications  droperidol  (INAPSINE ) 2.5 MG/ML injection 1.25 mg (1.25 mg Intravenous Given 12/12/24 0327)  diphenhydrAMINE  (BENADRYL ) injection 12.5 mg (12.5 mg Intravenous Given 12/12/24 0320)  sodium chloride  0.9 % bolus 500 mL (0 mLs Intravenous Stopped 12/12/24 0511)  ketorolac  (TORADOL ) 30 MG/ML injection 15 mg (15 mg Intravenous Given 12/12/24 0323)  dexamethasone  (DECADRON ) injection 10 mg (10 mg Intravenous Given 12/12/24 0322)    (Note:  hospital course my include additional interventions and/or labs/studies not listed above.)  Patient has obvious viral symptoms and even though her respiratory viral panel is negative I strongly suspect either influenza or influenza-like illness.  Vital signs are reassuring.   Patient is not exhibiting a significant cough and the symptoms are mostly upper respiratory.  She has no meningismus.  I think that her viral illness has caused a persistent migraine.  I have ordered the medications listed above and we will monitor but I anticipate she will feel better and will be appropriate for discharge and outpatient management.  No indication for emergent imaging at this time.  Patient and mother agree with the plan.  Clinical Course as of 12/12/24 0516  Sat Dec 12, 2024  9485 Migraine has resolved, patient and mother are ready to go home.  The patient's medical screening exam is reassuring with no indication of an emergent medical condition requiring hospitalization or additional evaluation at this point.  The patient is safe and appropriate for discharge and outpatient follow up. [CF]    Clinical Course User Index [CF] Gordan Huxley, MD     FINAL CLINICAL IMPRESSION(S) / ED DIAGNOSES   Final diagnoses:  Generalized headache  Influenza-like illness     Rx / DC Orders   ED Discharge Orders     None        Note:  This document was prepared using Dragon voice recognition software and may include unintentional dictation errors.   Gordan Huxley, MD 12/12/24 [phone]  "
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
## 2024-12-12 NOTE — Discharge Instructions (Signed)
 We believe that your viral illness led to a persistent migraine headache.  Please drink plenty of fluids and use over-the-counter ibuprofen  and/or Tylenol  as needed for pain.  Please follow-up with your regular doctor at the next available opportunity and return to the emergency department if you develop new or worsening symptoms that concern you.

## 2024-12-12 NOTE — ED Triage Notes (Signed)
 Patient brought in by mother from home for headache, nasal congestion, body aches, nausea since yesterday. Patient states her vision is not blurry, but she feels like she's having trouble seeing. Hx of migraines. Recent sick contacts. AxOx4.
